# Patient Record
Sex: Male | Born: 1980
Health system: Southern US, Community
[De-identification: ages and names within clinical notes are randomized; demographics above are authoritative.]

---

## 2005-08-11 ENCOUNTER — Emergency Department (HOSPITAL_COMMUNITY): Admission: EM | Admit: 2005-08-11 | Discharge: 2005-08-11 | Payer: Self-pay | Admitting: Emergency Medicine

## 2005-11-20 ENCOUNTER — Emergency Department (HOSPITAL_COMMUNITY): Admission: EM | Admit: 2005-11-20 | Discharge: 2005-11-20 | Payer: Self-pay | Admitting: Family Medicine

## 2006-12-18 ENCOUNTER — Emergency Department (HOSPITAL_COMMUNITY): Admission: EM | Admit: 2006-12-18 | Discharge: 2006-12-18 | Payer: Self-pay | Admitting: Emergency Medicine
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
## 2017-09-09 DIAGNOSIS — F4323 Adjustment disorder with mixed anxiety and depressed mood: Secondary | ICD-10-CM | POA: Diagnosis not present

## 2017-09-16 DIAGNOSIS — F4322 Adjustment disorder with anxiety: Secondary | ICD-10-CM | POA: Diagnosis not present

## 2017-09-23 DIAGNOSIS — F4322 Adjustment disorder with anxiety: Secondary | ICD-10-CM | POA: Diagnosis not present

## 2017-10-11 DIAGNOSIS — R079 Chest pain, unspecified: Secondary | ICD-10-CM | POA: Diagnosis not present

## 2017-10-11 DIAGNOSIS — R0602 Shortness of breath: Secondary | ICD-10-CM | POA: Diagnosis not present

## 2017-10-11 DIAGNOSIS — Z9109 Other allergy status, other than to drugs and biological substances: Secondary | ICD-10-CM | POA: Diagnosis not present

## 2017-10-11 DIAGNOSIS — Z23 Encounter for immunization: Secondary | ICD-10-CM | POA: Diagnosis not present

## 2017-10-11 DIAGNOSIS — E782 Mixed hyperlipidemia: Secondary | ICD-10-CM | POA: Diagnosis not present

## 2017-10-11 DIAGNOSIS — Z0000 Encounter for general adult medical examination without abnormal findings: Secondary | ICD-10-CM | POA: Diagnosis not present

## 2018-05-15 DIAGNOSIS — Z3009 Encounter for other general counseling and advice on contraception: Secondary | ICD-10-CM | POA: Diagnosis not present

## 2018-05-30 ENCOUNTER — Other Ambulatory Visit: Payer: Self-pay

## 2018-05-30 DIAGNOSIS — R062 Wheezing: Secondary | ICD-10-CM | POA: Diagnosis not present

## 2018-05-30 DIAGNOSIS — J209 Acute bronchitis, unspecified: Secondary | ICD-10-CM | POA: Diagnosis not present

## 2018-05-30 DIAGNOSIS — R05 Cough: Secondary | ICD-10-CM | POA: Diagnosis not present

## 2018-05-30 DIAGNOSIS — R6889 Other general symptoms and signs: Secondary | ICD-10-CM

## 2018-06-05 LAB — NOVEL CORONAVIRUS, NAA: SARS-COV-2, NAA: NOT DETECTED

## 2018-06-24 DIAGNOSIS — F4320 Adjustment disorder, unspecified: Secondary | ICD-10-CM | POA: Diagnosis not present

## 2018-07-08 DIAGNOSIS — F4320 Adjustment disorder, unspecified: Secondary | ICD-10-CM | POA: Diagnosis not present

## 2018-09-02 DIAGNOSIS — Z302 Encounter for sterilization: Secondary | ICD-10-CM | POA: Diagnosis not present

## 2018-10-21 DIAGNOSIS — Z0000 Encounter for general adult medical examination without abnormal findings: Secondary | ICD-10-CM | POA: Diagnosis not present

## 2019-01-09 DIAGNOSIS — M25531 Pain in right wrist: Secondary | ICD-10-CM | POA: Diagnosis not present

## 2019-02-03 ENCOUNTER — Encounter: Payer: Self-pay | Admitting: Orthopaedic Surgery

## 2019-02-03 ENCOUNTER — Ambulatory Visit (INDEPENDENT_AMBULATORY_CARE_PROVIDER_SITE_OTHER): Payer: BC Managed Care – PPO | Admitting: Orthopaedic Surgery

## 2019-02-03 ENCOUNTER — Ambulatory Visit: Payer: Self-pay

## 2019-02-03 ENCOUNTER — Other Ambulatory Visit: Payer: Self-pay

## 2019-02-03 DIAGNOSIS — M25531 Pain in right wrist: Secondary | ICD-10-CM | POA: Diagnosis not present

## 2019-02-03 MED ORDER — TRAMADOL HCL 50 MG PO TABS: 50.0000 mg | ORAL_TABLET | Freq: Three times a day (TID) | ORAL | 0 refills | Status: AC | PRN

## 2019-02-03 NOTE — Progress Notes (Signed)
   Office Visit Note   Patient: Charles Downs           Date of Birth: [birth year]           MRN: [id number] Visit Date: 02/03/2019              Requested by: No referring provider defined for this encounter. PCP: Kristen Loader, FNP   Assessment & Plan: Visit Diagnoses:  1. Pain in right wrist     Plan: chronic ulnar sided right wrist pain.  Due to the chronicity of symptoms and lack of improvement with conservative treatments, we will obtain mri with and without contrast of the right wrist to assess for structural abnormalities.  Follow up after MRI.   Follow-Up Instructions: Return for after MRI.   Orders:  Orders Placed This Encounter  Procedures  . XR Wrist Complete Right   Meds ordered this encounter  Medications  . traMADol (ULTRAM) 50 MG tablet    Sig: Take 1 tablet (50 mg total) by mouth 3 (three) times daily as needed.    Dispense:  30 tablet    Refill:  0      Procedures: No procedures performed   Clinical Data: No additional findings.   Subjective: Chief Complaint  Patient presents with  . Right Wrist - Pain    HPI patient is a pleasant 38 year old gentleman who presents with recurrent right ulnar sided wrist pain.  This initially began 5 years ago without injury.  His symptoms improved with medication that he cannot remember which was prescribed by a hand specialist.  His pain recently returned without a specific injury.  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  He has had ulnar sided pain since with associated fatigue.  Pain is worse with gripping activities to include holding a cell  Phone.  His pcp has given him meloxicam, gabapentin and prednisone without relief of symptoms.  No numbness, tingling or burning.   Review of Systems as detailed in HPI.  All others negative   Objective: Vital Signs: There were no vitals taken  for this visit.  Physical Exam well developed and well nourished gentleman in no acute distress.  Ortho Exam right wrist has mild tenderness over first dorsal compartment.  Minimally positive finkelstein.  No tenderness to the ulnar styloid.  Mild tenderness to the tfcc with a negative grind test and no crepitus.  No pain with wrist flexion, extension, radial or ulnar deviation.  He is neurovascularly intact distally.  Specialty Comments:  No specialty comments available.  Imaging: Xr Wrist Complete Right  Result Date: 02/03/2019 No acute or structural abnormality    PMFS History: There are no active problems to display for this patient.  History reviewed. No pertinent past medical history.  History reviewed. No pertinent family history.  History reviewed. No pertinent surgical history. Social History   Occupational History  . Not on file  Tobacco Use  . Smoking status: Never Smoker  . Smokeless tobacco: Never Used  Substance and Sexual Activity  . Alcohol use: Not on file  . Drug use: Not on file  . Sexual activity: Not on file

## 2019-02-04 NOTE — Addendum Note (Signed)
Addended by: Precious Bard on: 02/04/2019 08:24 AM   Modules accepted: Orders

## 2019-02-10 ENCOUNTER — Telehealth: Payer: Self-pay

## 2019-02-10 NOTE — Telephone Encounter (Signed)
Patient Charles Downs on triage phone stating that he had questions regarding his office visit last week. Would like a call back to further discuss. [phone]

## 2019-02-11 NOTE — Telephone Encounter (Signed)
Patient would like to check status on MRI.

## 2019-02-13 ENCOUNTER — Other Ambulatory Visit: Payer: Self-pay | Admitting: Physician Assistant

## 2019-02-14 ENCOUNTER — Other Ambulatory Visit: Payer: Self-pay

## 2019-02-14 ENCOUNTER — Ambulatory Visit
Admission: RE | Admit: 2019-02-14 | Discharge: 2019-02-14 | Disposition: A | Discharge status: Home / Self Care | Payer: BC Managed Care – PPO | Source: Ambulatory Visit | Attending: Physician Assistant | Admitting: Physician Assistant

## 2019-02-14 DIAGNOSIS — M25531 Pain in right wrist: Secondary | ICD-10-CM | POA: Diagnosis not present

## 2019-02-14 MED ORDER — GADOBENATE DIMEGLUMINE 529 MG/ML IV SOLN
15.0000 mL | Freq: Once | INTRAVENOUS | Status: AC | PRN
  Administered 2019-02-14: 15 mL via INTRAVENOUS

## 2019-02-16 NOTE — Progress Notes (Signed)
F/u to discuss

## 2019-02-17 ENCOUNTER — Other Ambulatory Visit: Payer: Self-pay

## 2019-02-17 ENCOUNTER — Ambulatory Visit (INDEPENDENT_AMBULATORY_CARE_PROVIDER_SITE_OTHER): Payer: BC Managed Care – PPO | Admitting: Orthopaedic Surgery

## 2019-02-17 DIAGNOSIS — M25531 Pain in right wrist: Secondary | ICD-10-CM | POA: Diagnosis not present

## 2019-02-17 MED ORDER — MELOXICAM 15 MG PO TABS: 7.5000 mg | ORAL_TABLET | Freq: Every day | ORAL | 0 refills | Status: DC

## 2019-02-17 NOTE — Addendum Note (Signed)
Addended by: Precious Bard on: 02/17/2019 04:57 PM   Modules accepted: Orders

## 2019-02-17 NOTE — Progress Notes (Signed)
   Office Visit Note   Patient: Charles Downs           Date of Birth: 1980/08/03           MRN: [id number] Visit Date: 02/17/2019              Requested by: Kristen Loader, [address] [place],  [place] [address] PCP: Kristen Loader, FNP   Assessment & Plan: Visit Diagnoses:  1. Pain in right wrist     Plan: My impression is chronic right wrist pain.  MRI is unremarkable.  We will obtain arthritis panel to rule out inflammatory arthritis.  We will place him back on meloxicam for couple weeks.  We will also immobilized with a wrist brace during the day.  Patient will follow-up in a month if not better.    Follow-Up Instructions: Return if symptoms worsen or fail to improve.   Orders:  No orders of the defined types were placed in this encounter.  Meds ordered this encounter  Medications  . meloxicam (MOBIC) 15 MG tablet    Sig: Take 0.5-1 tablets (7.5-15 mg total) by mouth daily.    Dispense:  30 tablet    Refill:  0      Procedures: No procedures performed   Clinical Data: No additional findings.   Subjective: Chief Complaint  Patient presents with  . Right Wrist - Pain, Follow-up    Charles Downs returns today for MRI review of the right wrist.  He states that about 5 years ago he had a similar episode that resolved with steroid and meloxicam.  He recently did take some steroid meloxicam which she has not noticed significant improvement from.  He states that his wrist hurts across the top especially when he holds it in 1 position.  Denies any numbness and tingling in his fingers.  Denies any history of gout.   Review of Systems  Constitutional: Negative.   All other systems reviewed and are negative.    Objective: Vital Signs: There were no vitals taken for this visit.  Physical Exam Vitals signs and nursing note reviewed.  Constitutional:      Appearance: He is well-developed.  Pulmonary:     Effort: Pulmonary effort is normal.  Abdominal:   Palpations: Abdomen is soft.  Skin:    General: Skin is warm.  Neurological:     Mental Status: He is alert and oriented to person, place, and time.  Psychiatric:        Behavior: Behavior normal.        Thought Content: Thought content normal.        Judgment: Judgment normal.     Ortho Exam Right wrist exam shows no swelling or soft tissue crepitus.  Normal range of motion without significant pain.  TFCC is nontender.   Specialty Comments:  No specialty comments available.  Imaging: No results found.   PMFS History: There are no active problems to display for this patient.  No past medical history on file.  No family history on file.  No past surgical history on file. Social History   Occupational History  . Not on file  Tobacco Use  . Smoking status: Never Smoker  . Smokeless tobacco: Never Used  Substance and Sexual Activity  . Alcohol use: Not on file  . Drug use: Not on file  . Sexual activity: Not on file

## 2019-03-07 ENCOUNTER — Other Ambulatory Visit: Payer: BC Managed Care – PPO

## 2019-03-14 DIAGNOSIS — Z20828 Contact with and (suspected) exposure to other viral communicable diseases: Secondary | ICD-10-CM | POA: Diagnosis not present

## 2019-03-20 ENCOUNTER — Ambulatory Visit: Payer: BC Managed Care – PPO | Admitting: Orthopaedic Surgery

## 2019-03-20 DIAGNOSIS — N451 Epididymitis: Secondary | ICD-10-CM | POA: Diagnosis not present

## 2019-04-01 ENCOUNTER — Other Ambulatory Visit: Payer: Self-pay | Admitting: Physician Assistant

## 2019-04-01 ENCOUNTER — Telehealth: Payer: Self-pay | Admitting: Orthopaedic Surgery

## 2019-04-01 MED ORDER — MELOXICAM 15 MG PO TABS: 7.5000 mg | ORAL_TABLET | Freq: Every day | ORAL | 0 refills | Status: AC

## 2019-04-01 NOTE — Telephone Encounter (Signed)
Patient called.   He would like a refill on his Meloxicam   Call back number: [phone]

## 2019-04-01 NOTE — Telephone Encounter (Signed)
Just sent in

## 2019-04-03 DIAGNOSIS — N451 Epididymitis: Secondary | ICD-10-CM | POA: Diagnosis not present

## 2019-05-30 ENCOUNTER — Ambulatory Visit: Payer: BC Managed Care – PPO

## 2020-09-08 DIAGNOSIS — Z62898 Other specified problems related to upbringing: Secondary | ICD-10-CM | POA: Diagnosis not present

## 2020-09-08 DIAGNOSIS — T7632XA Child psychological abuse, suspected, initial encounter: Secondary | ICD-10-CM | POA: Diagnosis not present

## 2021-01-19 DIAGNOSIS — Z20828 Contact with and (suspected) exposure to other viral communicable diseases: Secondary | ICD-10-CM | POA: Diagnosis not present

## 2021-01-19 DIAGNOSIS — R0981 Nasal congestion: Secondary | ICD-10-CM | POA: Diagnosis not present

## 2021-01-19 DIAGNOSIS — R519 Headache, unspecified: Secondary | ICD-10-CM | POA: Diagnosis not present

## 2021-03-01 DIAGNOSIS — U071 COVID-19: Secondary | ICD-10-CM | POA: Diagnosis not present

## 2021-03-01 DIAGNOSIS — R509 Fever, unspecified: Secondary | ICD-10-CM | POA: Diagnosis not present

## 2021-03-01 DIAGNOSIS — R051 Acute cough: Secondary | ICD-10-CM | POA: Diagnosis not present

## 2021-03-01 DIAGNOSIS — J029 Acute pharyngitis, unspecified: Secondary | ICD-10-CM | POA: Diagnosis not present

## 2021-09-11 DIAGNOSIS — T7632XA Child psychological abuse, suspected, initial encounter: Secondary | ICD-10-CM | POA: Diagnosis not present

## 2021-09-11 DIAGNOSIS — Z62898 Other specified problems related to upbringing: Secondary | ICD-10-CM | POA: Diagnosis not present

## 2021-12-13 DIAGNOSIS — Z23 Encounter for immunization: Secondary | ICD-10-CM | POA: Diagnosis not present

## 2021-12-13 DIAGNOSIS — Z9109 Other allergy status, other than to drugs and biological substances: Secondary | ICD-10-CM | POA: Diagnosis not present

## 2021-12-13 DIAGNOSIS — B351 Tinea unguium: Secondary | ICD-10-CM | POA: Diagnosis not present

## 2021-12-13 DIAGNOSIS — E782 Mixed hyperlipidemia: Secondary | ICD-10-CM | POA: Diagnosis not present

## 2022-08-10 DIAGNOSIS — N46023 Azoospermia due to obstruction of efferent ducts: Secondary | ICD-10-CM | POA: Diagnosis not present

## 2022-08-10 DIAGNOSIS — N3943 Post-void dribbling: Secondary | ICD-10-CM | POA: Diagnosis not present

## 2023-02-13 DIAGNOSIS — Z0000 Encounter for general adult medical examination without abnormal findings: Secondary | ICD-10-CM | POA: Diagnosis not present

## 2023-02-14 DIAGNOSIS — Z23 Encounter for immunization: Secondary | ICD-10-CM | POA: Diagnosis not present

## 2023-02-14 DIAGNOSIS — E782 Mixed hyperlipidemia: Secondary | ICD-10-CM | POA: Diagnosis not present

## 2023-02-14 DIAGNOSIS — J3089 Other allergic rhinitis: Secondary | ICD-10-CM | POA: Diagnosis not present
